# Patient Record
Sex: Male | Born: 1985 | Race: Black or African American | Hispanic: No | Smoking: Current some day smoker
Health system: Southern US, Community
[De-identification: ages and names within clinical notes are randomized; demographics above are authoritative.]

---
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
## 2007-04-11 ENCOUNTER — Emergency Department (HOSPITAL_COMMUNITY): Admission: EM | Admit: 2007-04-11 | Discharge: 2007-04-11 | Payer: Self-pay | Admitting: Emergency Medicine

## 2008-04-03 ENCOUNTER — Emergency Department (HOSPITAL_COMMUNITY): Admission: EM | Admit: 2008-04-03 | Discharge: 2008-04-03 | Payer: Self-pay | Admitting: Emergency Medicine

## 2009-01-17 ENCOUNTER — Emergency Department (HOSPITAL_COMMUNITY): Admission: EM | Admit: 2009-01-17 | Discharge: 2009-01-17 | Payer: Self-pay | Admitting: Emergency Medicine

## 2010-01-04 IMAGING — CR DG CHEST 2V
2 series · 2 of 2 positions shown · non-contrast
Comparison: None

CLINICAL DATA: Shortness of breath; panic attack.

CHEST - 2 VIEW

[w chest pa]
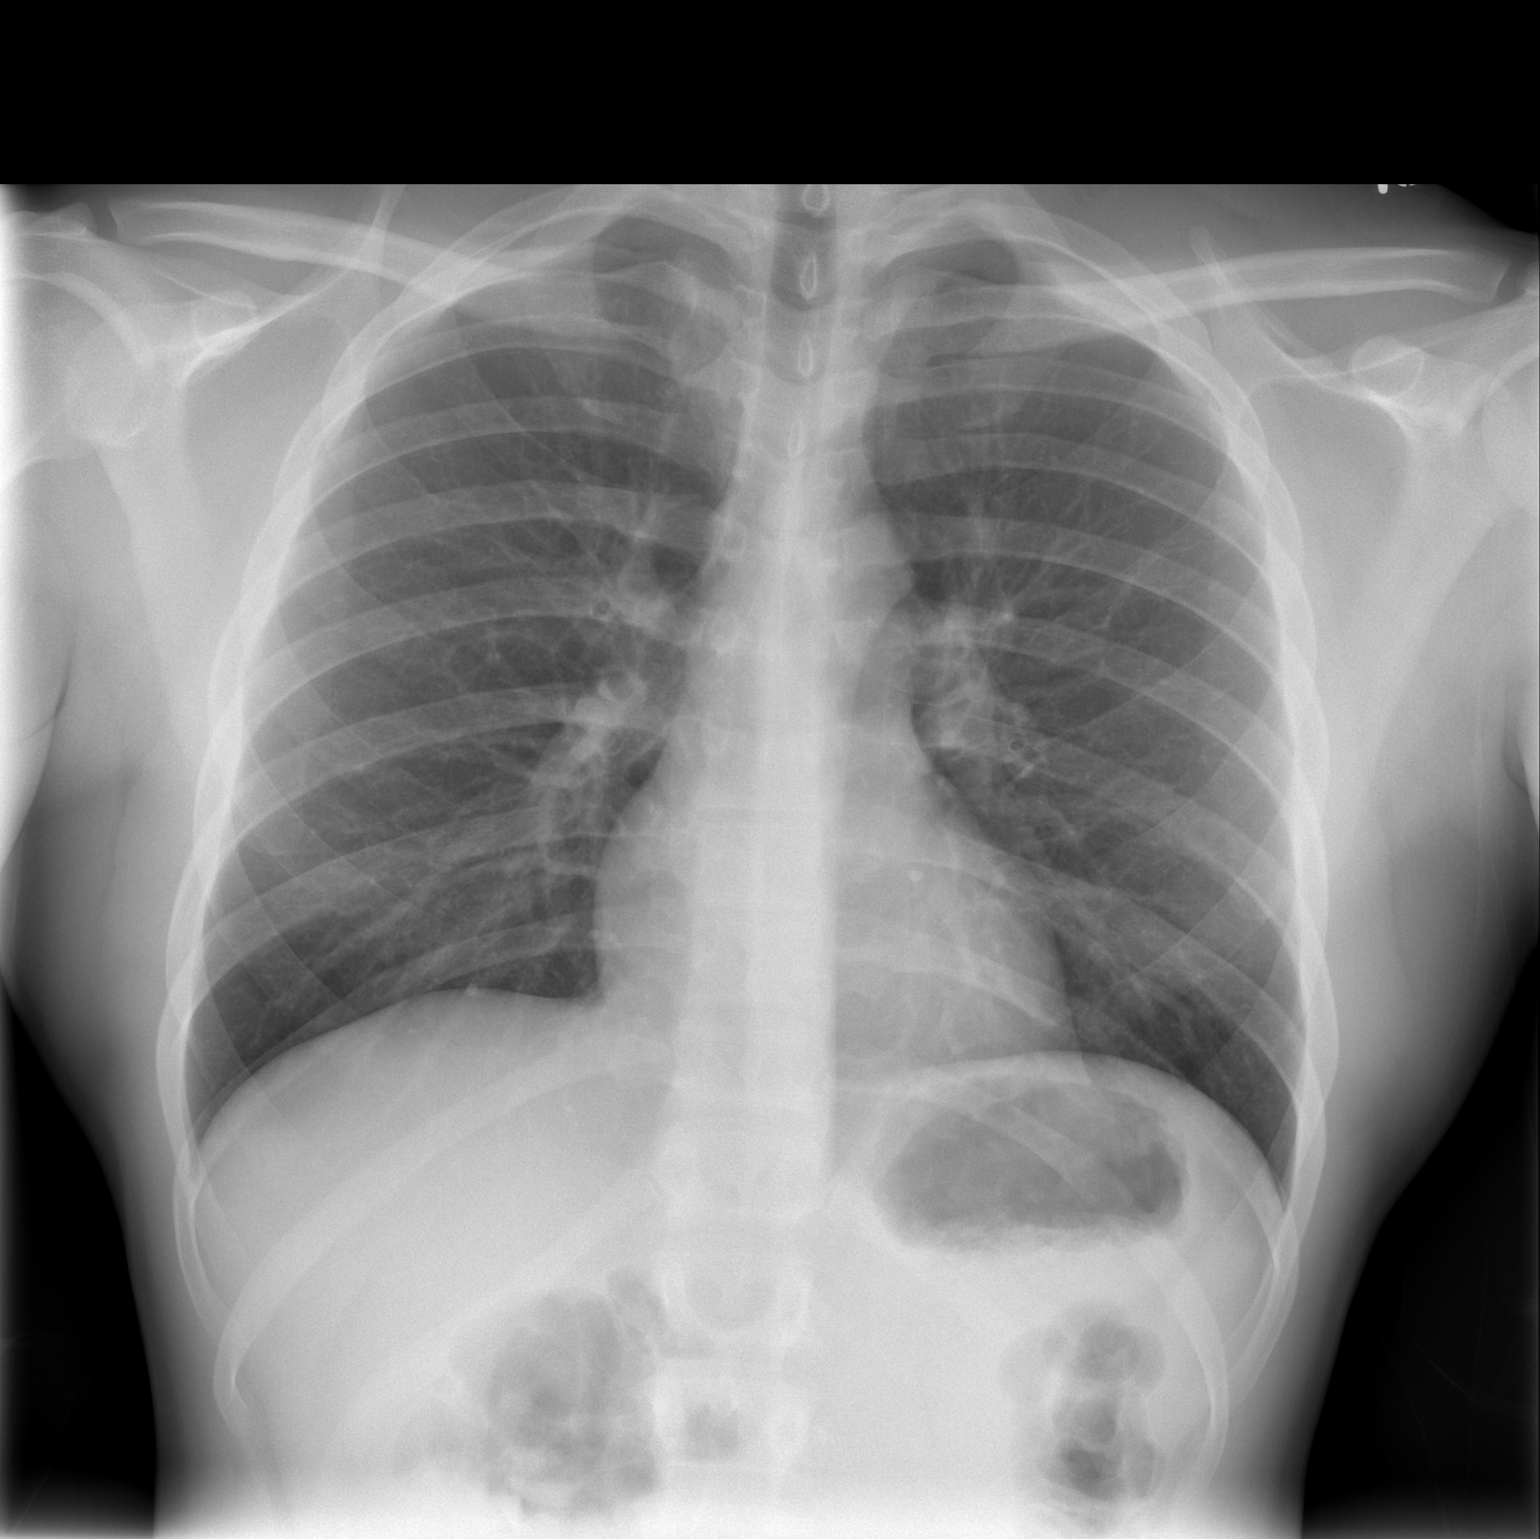

[w chest lat]
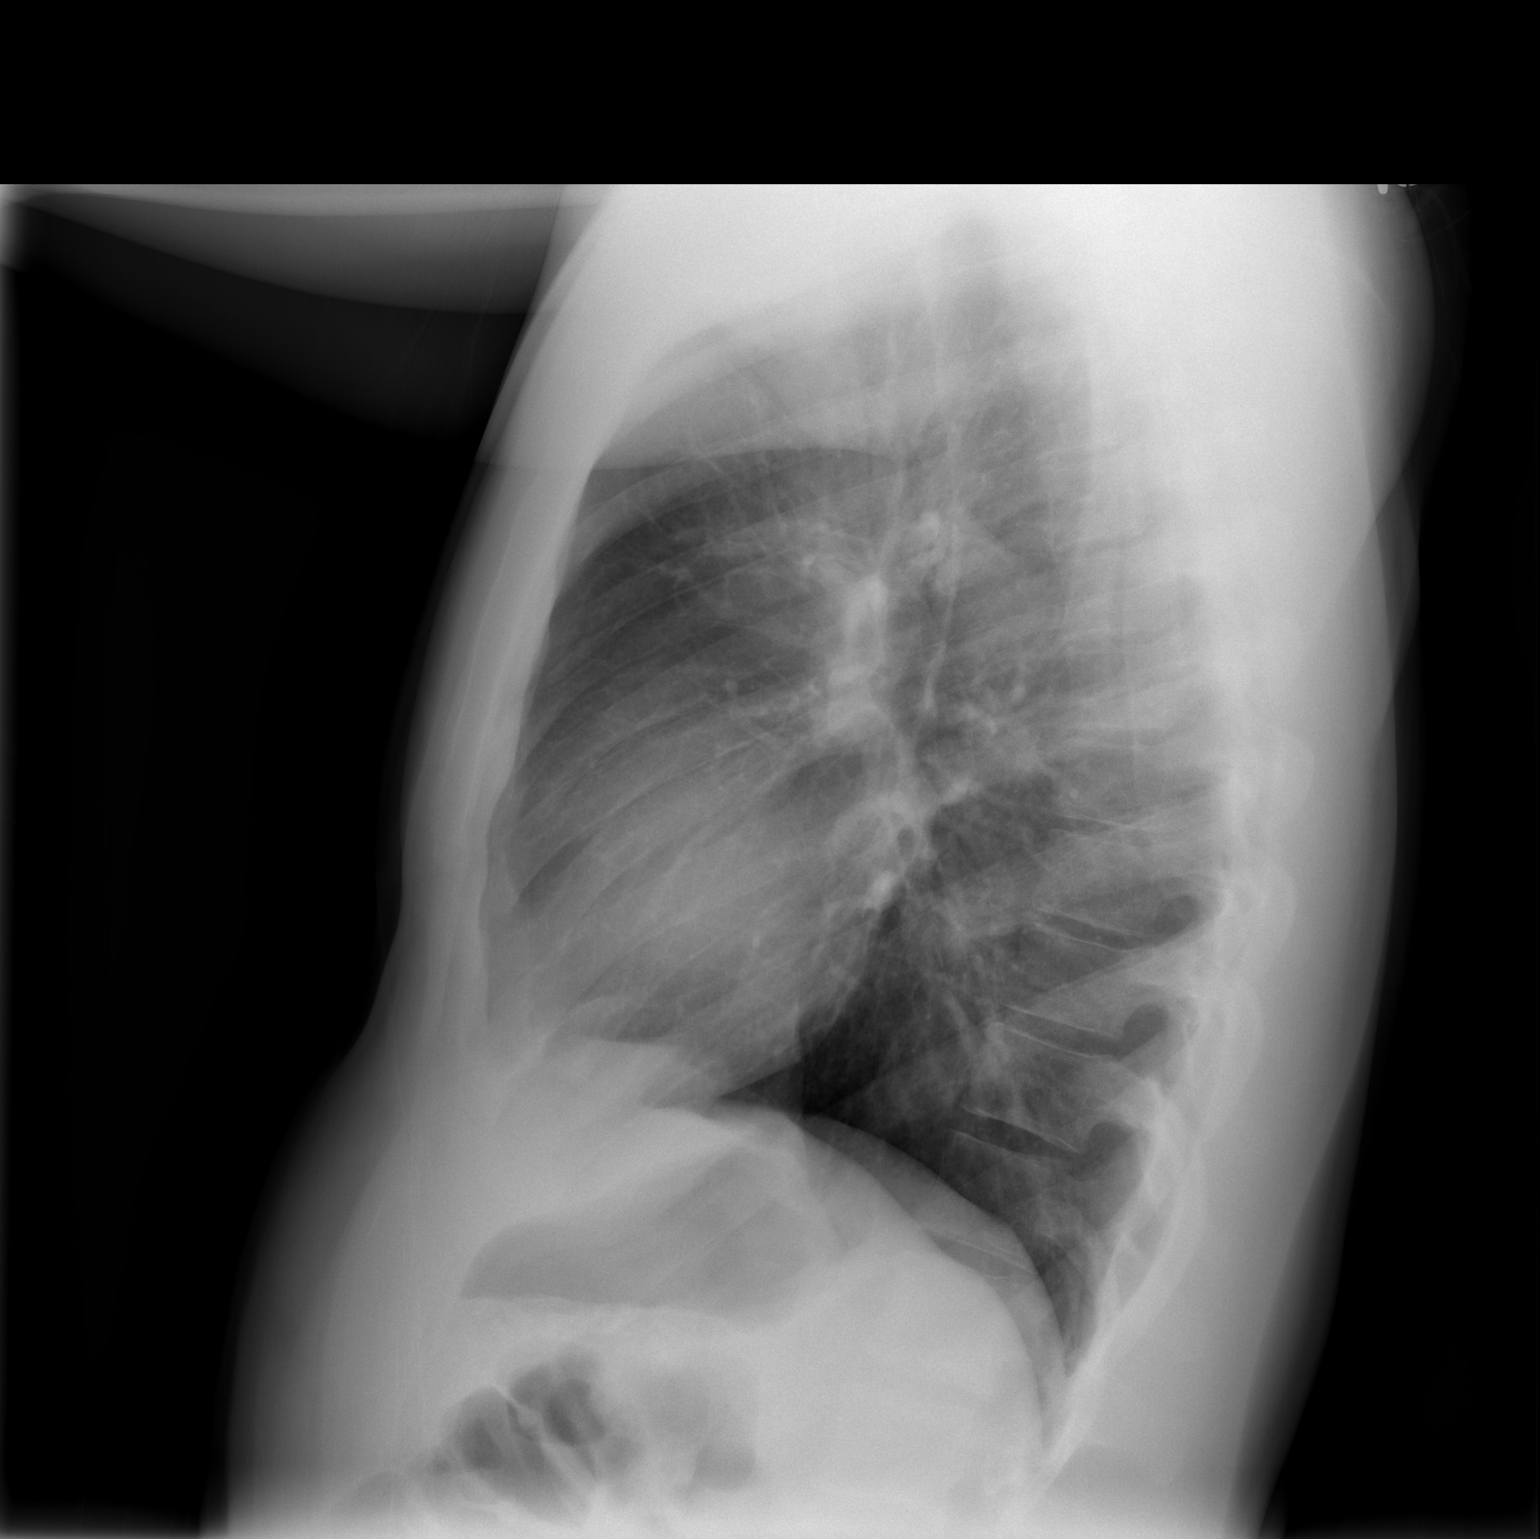

[2 of 2 positions shown; findings below may reference images not displayed]

FINDINGS: The lungs are well-aerated and clear.  Mild peribronchial
thickening is noted.  There is no evidence of focal opacification,
pleural effusion or pneumothorax.

The heart is normal in size; the mediastinal contour is within
normal limits.  No acute osseous abnormalities are seen.
IMPRESSION: No acute cardiopulmonary process seen.

## 2010-07-13 LAB — CBC
MCHC: 32.6 g/dL (ref 30.0–36.0)
RBC: 5.95 MIL/uL — ABNORMAL HIGH (ref 4.22–5.81)
RDW: 13.6 % (ref 11.5–15.5)

## 2010-07-13 LAB — DIFFERENTIAL
Eosinophils Relative: 1 % (ref 0–5)
Lymphs Abs: 0.7 10*3/uL (ref 0.7–4.0)
Monocytes Relative: 8 % (ref 3–12)
Neutrophils Relative %: 82 % — ABNORMAL HIGH (ref 43–77)

## 2010-07-13 LAB — BASIC METABOLIC PANEL
Creatinine, Ser: 1.2 mg/dL (ref 0.4–1.5)
Glucose, Bld: 83 mg/dL (ref 70–99)
Potassium: 3.9 mEq/L (ref 3.5–5.1)
Sodium: 139 mEq/L (ref 135–145)

## 2010-12-17 LAB — RAPID STREP SCREEN (MED CTR MEBANE ONLY): Streptococcus, Group A Screen (Direct): NEGATIVE

## 2014-02-06 ENCOUNTER — Emergency Department (HOSPITAL_COMMUNITY)
Admission: EM | Admit: 2014-02-06 | Discharge: 2014-02-06 | Disposition: A | Discharge status: Home / Self Care | Payer: Self-pay | Attending: Emergency Medicine | Admitting: Emergency Medicine

## 2014-02-06 ENCOUNTER — Encounter (HOSPITAL_COMMUNITY): Payer: Self-pay | Admitting: Emergency Medicine

## 2014-02-06 DIAGNOSIS — Y9289 Other specified places as the place of occurrence of the external cause: Secondary | ICD-10-CM | POA: Insufficient documentation

## 2014-02-06 DIAGNOSIS — Y9389 Activity, other specified: Secondary | ICD-10-CM | POA: Insufficient documentation

## 2014-02-06 DIAGNOSIS — Y998 Other external cause status: Secondary | ICD-10-CM | POA: Insufficient documentation

## 2014-02-06 DIAGNOSIS — T148XXA Other injury of unspecified body region, initial encounter: Secondary | ICD-10-CM

## 2014-02-06 DIAGNOSIS — W500XXA Accidental hit or strike by another person, initial encounter: Secondary | ICD-10-CM | POA: Insufficient documentation

## 2014-02-06 DIAGNOSIS — S00211A Abrasion of right eyelid and periocular area, initial encounter: Secondary | ICD-10-CM | POA: Insufficient documentation

## 2014-02-06 MED ORDER — IBUPROFEN 600 MG PO TABS: 600.0000 mg | ORAL_TABLET | Freq: Four times a day (QID) | ORAL | Status: DC | PRN
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
## 2014-02-06 NOTE — Discharge Instructions (Signed)
Abrasion An abrasion is a cut or scrape of the skin. Abrasions do not extend through all layers of the skin and most heal within 10 days. It is important to care for your abrasion properly to prevent infection. CAUSES  Most abrasions are caused by falling on, or gliding across, the ground or other surface. When your skin rubs on something, the outer and inner layer of skin rubs off, causing an abrasion. DIAGNOSIS  Your caregiver will be able to diagnose an abrasion during a physical exam.  TREATMENT  Your treatment depends on how large and deep the abrasion is. Generally, your abrasion will be cleaned with water and a mild soap to remove any dirt or debris. An antibiotic ointment may be put over the abrasion to prevent an infection. A bandage (dressing) may be wrapped around the abrasion to keep it from getting dirty.  You may need a tetanus shot if:  You cannot remember when you had your last tetanus shot.  You have never had a tetanus shot.  The injury broke your skin. If you get a tetanus shot, your arm may swell, get red, and feel warm to the touch. This is common and not a problem. If you need a tetanus shot and you choose not to have one, there is a rare chance of getting tetanus. Sickness from tetanus can be serious.  HOME CARE INSTRUCTIONS   If a dressing was applied, change it at least once a day or as directed by your caregiver. If the bandage sticks, soak it off with warm water.   Wash the area with water and a mild soap to remove all the ointment 2 times a day. Rinse off the soap and pat the area dry with a clean towel.   Reapply any ointment as directed by your caregiver. This will help prevent infection and keep the bandage from sticking. Use gauze over the wound and under the dressing to help keep the bandage from sticking.   Change your dressing right away if it becomes wet or dirty.   Only take over-the-counter or prescription medicines for pain, discomfort, or fever as  directed by your caregiver.   Follow up with your caregiver within 24-48 hours for a wound check, or as directed. If you were not given a wound-check appointment, look closely at your abrasion for redness, swelling, or pus. These are signs of infection. SEEK IMMEDIATE MEDICAL CARE IF:   You have increasing pain in the wound.   You have redness, swelling, or tenderness around the wound.   You have pus coming from the wound.   You have a fever or persistent symptoms for more than 2-3 days.  You have a fever and your symptoms suddenly get worse.  You have a bad smell coming from the wound or dressing.  MAKE SURE YOU:   Understand these instructions.  Will watch your condition.  Will get help right away if you are not doing well or get worse. Document Released: 12/23/2004 Document Revised: 03/01/2012 Document Reviewed: 02/16/2011 [facility] Patient Information 2015 [place], [place]. This information is not intended to replace advice given to you by your health care provider. Make sure you discuss any questions you have with your health care provider.  Eye Contusion An eye contusion is a deep bruise of the eye. This is often called a "black eye." Contusions are the result of an injury that caused bleeding under the skin. The contusion may turn blue, purple, or yellow. Minor injuries will give you  a painless contusion, but more severe contusions may stay painful and swollen for a few weeks. If the eye contusion only involves the eyelids and tissues around the eye, the injured area will get better within a few days to weeks. However, eye contusions can be serious and affect the eyeball and sight. CAUSES   Blunt injury or trauma to the face or eye area.  A forehead injury that causes the blood under the skin to work its way down to the eyelids.  Rubbing the eyes due to irritation. SYMPTOMS   Swelling and redness around the eye.  Bruising around the eye.  Tenderness, soreness, or  pain around the eye.  Blurry vision.  Tearing.  Eyeball redness. DIAGNOSIS  A diagnosis is usually based on a thorough exam of the eye and surrounding area. The eye must be looked at carefully to make sure it is not injured and to make sure nothing else will threaten your vision. A vision test may be done. An X-ray or computed tomography (CT) scan may be needed to determine if there are any associated injuries, such as broken bones (fractures). TREATMENT  If there is an injury to the eye, treatment will be determined by the nature of the injury. HOME CARE INSTRUCTIONS   Put ice on the injured area.  Put ice in a plastic bag.  Place a towel between your skin and the bag.  Leave the ice on for 15-20 minutes, 03-04 times a day.  If it is determined that there is no injury to the eye, you may continue normal activities.  Sunglasses may be worn to protect your eyes from bright light if light is uncomfortable.  Sleep with your head elevated. You can put an extra pillow under your head. This may help with discomfort.  Only take over-the-counter or prescription medicines for pain, discomfort, or fever as directed by your caregiver. Do not take aspirin for the first few days. This may increase bruising. SEEK IMMEDIATE MEDICAL CARE IF:   You have any form of vision loss.  You have double vision.  You feel nauseous.  You feel dizzy, sleepy, or like you will faint.  You have any fluid discharge from the eye or your nose.  You have swelling and discoloration that does not fade. MAKE SURE YOU:   Understand these instructions.  Will watch your condition.  Will get help right away if you are not doing well or get worse. Document Released: 03/12/2000 Document Revised: 06/07/2011 Document Reviewed: 01/29/2011 [facility] Patient Information 2015 [place]. This information is not intended to replace advice given to you by your health care provider. Make sure you discuss any  questions you have with your health care provider.
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
## 2014-02-06 NOTE — ED Notes (Signed)
Bed: WTR8 Expected date:  Expected time:  Means of arrival:  Comments: [facility] [employer]

## 2019-07-10 ENCOUNTER — Emergency Department (HOSPITAL_COMMUNITY): Admission: EM | Admit: 2019-07-10 | Discharge: 2019-07-10 | Discharge status: Absent without leave | Payer: Self-pay

## 2019-07-10 ENCOUNTER — Other Ambulatory Visit: Payer: Self-pay

## 2019-09-24 ENCOUNTER — Encounter (HOSPITAL_COMMUNITY): Payer: Self-pay | Admitting: Emergency Medicine

## 2019-09-24 ENCOUNTER — Emergency Department (HOSPITAL_COMMUNITY)
Admission: EM | Admit: 2019-09-24 | Discharge: 2019-09-25 | Disposition: A | Discharge status: Home / Self Care | Payer: Self-pay | Attending: Emergency Medicine | Admitting: Emergency Medicine

## 2019-09-24 ENCOUNTER — Other Ambulatory Visit: Payer: Self-pay

## 2019-09-24 DIAGNOSIS — K1379 Other lesions of oral mucosa: Secondary | ICD-10-CM

## 2019-09-24 DIAGNOSIS — K0889 Other specified disorders of teeth and supporting structures: Secondary | ICD-10-CM | POA: Insufficient documentation

## 2019-09-24 MED ORDER — KETOROLAC TROMETHAMINE 60 MG/2ML IM SOLN
60.0000 mg | Freq: Once | INTRAMUSCULAR | Status: AC
  Administered 2019-09-25: 60 mg via INTRAMUSCULAR
  Filled 2019-09-24: qty 2

## 2019-09-24 MED ORDER — IBUPROFEN 800 MG PO TABS: 800.0000 mg | ORAL_TABLET | Freq: Three times a day (TID) | ORAL | 0 refills | Status: AC

## 2019-09-24 MED ORDER — CLINDAMYCIN HCL 300 MG PO CAPS: 300.0000 mg | ORAL_CAPSULE | Freq: Four times a day (QID) | ORAL | 0 refills | Status: AC

## 2019-09-24 MED ORDER — CLINDAMYCIN HCL 300 MG PO CAPS
300.0000 mg | ORAL_CAPSULE | Freq: Once | ORAL | Status: AC
  Administered 2019-09-25: 300 mg via ORAL
  Filled 2019-09-24: qty 1

## 2019-09-24 MED ORDER — OXYCODONE-ACETAMINOPHEN 5-325 MG PO TABS
2.0000 | ORAL_TABLET | Freq: Once | ORAL | Status: AC
  Administered 2019-09-25: 2 via ORAL
  Filled 2019-09-24: qty 2

## 2019-09-24 NOTE — ED Triage Notes (Addendum)
Pt c/o of dental pain on right side and jaw pain on both sides. Pain started 2 - 3 days ago. Pt describes pain as sharpe and constant. Pt denies injury to the area. Pt having difficulty eating due to pain. Home pain medication with no relief.

## 2019-09-24 NOTE — Discharge Instructions (Addendum)
Try using sensodyne toothpaste for the next few days and see if it helps. If symptoms not improving by Thursday, consider seeing dentist.

## 2019-09-25 NOTE — ED Provider Notes (Signed)
[facility]-EMERGENCY DEPT Provider Note   CSN: [id number] Arrival date & time: 09/24/19  2316     History Chief Complaint  Patient presents with  . Dental Pain    Benjamin James is a 34 y.o. male.   Dental Pain Location:  Generalized Quality:  Aching and sharp Severity:  Mild Onset quality:  Gradual Duration:  3 days Timing:  Intermittent Progression:  Worsening Chronicity:  New Worsened by:  Cold food/drink Ineffective treatments:  None tried Associated symptoms: no congestion   Risk factors: no alcohol problem        History reviewed. No pertinent past medical history.  There are no problems to display for this patient.   History reviewed. No pertinent surgical history.     No family history on file.  Social History   Tobacco Use  . Smoking status: Not on file  Substance Use Topics  . Alcohol use: Not on file  . Drug use: Not on file    Home Medications Prior to Admission medications   Medication Sig Start Date End Date Taking? Authorizing Provider  clindamycin (CLEOCIN) 300 MG capsule Take 1 capsule (300 mg total) by mouth 4 (four) times daily. X 7 days 09/24/19   Lareen Mullings, Barbara Cower, MD  ibuprofen (ADVIL) 800 MG tablet Take 1 tablet (800 mg total) by mouth 3 (three) times daily. 09/24/19   Qamar Aughenbaugh, Barbara Cower, MD    Allergies    Patient has no known allergies.  Review of Systems   Review of Systems  HENT: Negative for congestion.   All other systems reviewed and are negative.   Physical Exam Updated Vital Signs BP (!) 148/91 (BP Location: Left Arm)   Pulse (!) 59   Temp 98.5 F (36.9 C) (Oral)   Resp 16   SpO2 96%   Physical Exam Vitals and nursing note reviewed.  Constitutional:      Appearance: He is well-developed.  HENT:     Head: Normocephalic and atraumatic.     Mouth/Throat:     Mouth: Mucous membranes are moist.     Pharynx: Oropharynx is clear.     Comments: Receding gingiva, no obvious swelling, erythema or  drainable abscesses. Significant halitosis.  Eyes:     Pupils: Pupils are equal, round, and reactive to light.  Cardiovascular:     Rate and Rhythm: Normal rate.  Pulmonary:     Effort: Pulmonary effort is normal. No respiratory distress.  Abdominal:     General: There is no distension.     Tenderness: There is no abdominal tenderness.  Musculoskeletal:        General: Normal range of motion.     Cervical back: Normal range of motion.  Skin:    General: Skin is warm and dry.  Neurological:     General: No focal deficit present.     Mental Status: He is alert.     Cranial Nerves: No cranial nerve deficit.     Sensory: No sensory deficit.     ED Results / Procedures / Treatments   Labs (all labs ordered are listed, but only abnormal results are displayed) Labs Reviewed - No data to display  EKG None  Radiology No results found.  Procedures Procedures (including critical care time)  Medications Ordered in ED Medications  oxyCODONE-acetaminophen (PERCOCET/ROXICET) 5-325 MG per tablet 2 tablet (2 tablets Oral Given 09/25/19 0005)  ketorolac (TORADOL) injection 60 mg (60 mg Intramuscular Given 09/25/19 0006)  clindamycin (CLEOCIN) capsule 300 mg (300  mg Oral Given 09/25/19 0006)    ED Course  I have reviewed the triage vital signs and the nursing notes.  Pertinent labs & imaging results that were available during my care of the patient were reviewed by me and considered in my medical decision making (see chart for details).    MDM Rules/Calculators/A&P                          With pain and severe odor of breath, concern for dental abscess. With worsening symptoms 2/2 temperature changes then consider gingivitis and nerve exposure, will try sensodyne at home. otherwise dental follow up  Final Clinical Impression(s) / ED Diagnoses Final diagnoses:  Oral pain    Rx / DC Orders ED Discharge Orders         Ordered    clindamycin (CLEOCIN) 300 MG capsule  4 times  daily     Discontinue  Reprint     09/24/19 2352    ibuprofen (ADVIL) 800 MG tablet  3 times daily     Discontinue  Reprint     09/24/19 2352           Britlyn Martine, Barbara Cower, MD 09/25/19 0140

## 2020-11-08 ENCOUNTER — Other Ambulatory Visit: Payer: Self-pay

## 2020-11-08 ENCOUNTER — Emergency Department (HOSPITAL_BASED_OUTPATIENT_CLINIC_OR_DEPARTMENT_OTHER): Payer: Self-pay

## 2020-11-08 ENCOUNTER — Emergency Department (HOSPITAL_BASED_OUTPATIENT_CLINIC_OR_DEPARTMENT_OTHER)
Admission: EM | Admit: 2020-11-08 | Discharge: 2020-11-08 | Disposition: A | Discharge status: Home / Self Care | Payer: Self-pay | Attending: Emergency Medicine | Admitting: Emergency Medicine

## 2020-11-08 ENCOUNTER — Encounter (HOSPITAL_BASED_OUTPATIENT_CLINIC_OR_DEPARTMENT_OTHER): Payer: Self-pay

## 2020-11-08 DIAGNOSIS — R1013 Epigastric pain: Secondary | ICD-10-CM | POA: Insufficient documentation

## 2020-11-08 DIAGNOSIS — Z87891 Personal history of nicotine dependence: Secondary | ICD-10-CM | POA: Insufficient documentation

## 2020-11-08 DIAGNOSIS — R109 Unspecified abdominal pain: Secondary | ICD-10-CM

## 2020-11-08 DIAGNOSIS — R112 Nausea with vomiting, unspecified: Secondary | ICD-10-CM | POA: Insufficient documentation

## 2020-11-08 LAB — COMPREHENSIVE METABOLIC PANEL
ALT: 19 U/L (ref 0–44)
AST: 18 U/L (ref 15–41)
Albumin: 4.5 g/dL (ref 3.5–5.0)
Alkaline Phosphatase: 55 U/L (ref 38–126)
Anion gap: 9 (ref 5–15)
BUN: 11 mg/dL (ref 6–20)
CO2: 27 mmol/L (ref 22–32)
Calcium: 9.2 mg/dL (ref 8.9–10.3)
Chloride: 100 mmol/L (ref 98–111)
Creatinine, Ser: 1.35 mg/dL — ABNORMAL HIGH (ref 0.61–1.24)
GFR, Estimated: >60 mL/min (ref >60)
Glucose, Bld: 113 mg/dL — ABNORMAL HIGH (ref 70–99)
Potassium: 3.9 mmol/L (ref 3.5–5.1)
Sodium: 136 mmol/L (ref 135–145)
Total Bilirubin: 1.6 mg/dL — ABNORMAL HIGH (ref 0.3–1.2)
Total Protein: 7.6 g/dL (ref 6.5–8.1)

## 2020-11-08 LAB — CBC WITH DIFFERENTIAL/PLATELET
Abs Immature Granulocytes: 0.02 10*3/uL (ref 0.00–0.07)
Basophils Absolute: 0 10*3/uL (ref 0.0–0.1)
Basophils Relative: 0 %
Eosinophils Absolute: 0.1 10*3/uL (ref 0.0–0.5)
Eosinophils Relative: 3 %
HCT: 45.1 % (ref 39.0–52.0)
Hemoglobin: 15.5 g/dL (ref 13.0–17.0)
Immature Granulocytes: 0 %
Lymphocytes Relative: 36 %
Lymphs Abs: 1.9 10*3/uL (ref 0.7–4.0)
MCH: 28.2 pg (ref 26.0–34.0)
MCHC: 34.4 g/dL (ref 30.0–36.0)
MCV: 82.1 fL (ref 80.0–100.0)
Monocytes Absolute: 0.5 10*3/uL (ref 0.1–1.0)
Monocytes Relative: 9 %
Neutro Abs: 2.8 10*3/uL (ref 1.7–7.7)
Neutrophils Relative %: 52 %
Platelets: 249 10*3/uL (ref 150–400)
RBC: 5.49 MIL/uL (ref 4.22–5.81)
RDW: 13.3 % (ref 11.5–15.5)
WBC: 5.4 10*3/uL (ref 4.0–10.5)
nRBC: 0 % (ref 0.0–0.2)

## 2020-11-08 LAB — LIPASE, BLOOD: Lipase: 26 U/L (ref 11–51)

## 2020-11-08 MED ORDER — ONDANSETRON 4 MG PO TBDP: 4.0000 mg | ORAL_TABLET | Freq: Three times a day (TID) | ORAL | 0 refills | Status: DC | PRN

## 2020-11-08 MED ORDER — IOHEXOL 300 MG/ML  SOLN
100.0000 mL | Freq: Once | INTRAMUSCULAR | Status: AC | PRN
  Administered 2020-11-08: 100 mL via INTRAVENOUS

## 2020-11-08 MED ORDER — DICYCLOMINE HCL 20 MG PO TABS: 20.0000 mg | ORAL_TABLET | Freq: Three times a day (TID) | ORAL | 0 refills | Status: AC | PRN

## 2020-11-08 MED ORDER — PANTOPRAZOLE SODIUM 40 MG PO TBEC: 40.0000 mg | DELAYED_RELEASE_TABLET | Freq: Every day | ORAL | 0 refills | Status: AC

## 2020-11-08 MED ORDER — ONDANSETRON HCL 4 MG/2ML IJ SOLN
4.0000 mg | Freq: Once | INTRAMUSCULAR | Status: AC
  Administered 2020-11-08: 4 mg via INTRAVENOUS
  Filled 2020-11-08: qty 2

## 2020-11-08 MED ORDER — PANTOPRAZOLE SODIUM 40 MG IV SOLR
40.0000 mg | Freq: Once | INTRAVENOUS | Status: AC
  Administered 2020-11-08: 40 mg via INTRAVENOUS
  Filled 2020-11-08: qty 40

## 2020-11-08 NOTE — ED Provider Notes (Signed)
Emergency Department Provider Note   I have reviewed the triage vital signs and the nursing notes.   HISTORY  Chief Complaint Abdominal Pain and Emesis   HPI Benjamin James is a 35 y.o. male with no significant past medical history presents emergency department with epigastric abdominal discomfort with nausea and vomiting.  Symptoms been building over the past 5 days.  No similar pain in the past.  Patient states when he eats it feels like it settles in the lower part of his chest/top part of his abdomen and feels uncomfortable.  He has a "bubbling" feeling in his abdomen and soon after eating has vomiting.  No pain radiating into the chest.  No shortness of breath.  Reports some subjective fevers at home from time to time.  No diarrhea.  No sick contacts.   History reviewed. No pertinent past medical history.  There are no problems to display for this patient.   History reviewed. No pertinent surgical history.  Allergies Patient has no known allergies.  History reviewed. No pertinent family history.  Social History Social History   Tobacco Use   Smoking status: Former    Types: Cigarettes   Smokeless tobacco: Never    Review of Systems  Constitutional: No fever/chills Eyes: No visual changes. ENT: No sore throat. Cardiovascular: Denies chest pain. Respiratory: Denies shortness of breath. Gastrointestinal: Positive epigastric abdominal pain. Positive nausea and vomiting.  No diarrhea.  No constipation. Genitourinary: Negative for dysuria. Musculoskeletal: Negative for back pain. Skin: Negative for rash. Neurological: Negative for headaches, focal weakness or numbness.  10-point ROS otherwise negative.  ____________________________________________   PHYSICAL EXAM:  VITAL SIGNS: ED Triage Vitals  Enc Vitals Group     BP 11/08/20 0720 (!) 149/92     Pulse Rate 11/08/20 0720 72     Resp 11/08/20 0720 18     Temp 11/08/20 0718 99.1 F (37.3 C)     Temp  Source 11/08/20 0718 Oral     SpO2 11/08/20 0720 100 %     Weight 11/08/20 0722 180 lb (81.6 kg)     Height 11/08/20 0722 5\' 9"  (1.753 m)   Constitutional: Alert and oriented. Well appearing and in no acute distress. Eyes: Conjunctivae are normal.  Head: Atraumatic. Nose: No congestion/rhinnorhea. Mouth/Throat: Mucous membranes are moist.  Neck: No stridor.  Cardiovascular: Normal rate, regular rhythm. Good peripheral circulation. Grossly normal heart sounds.   Respiratory: Normal respiratory effort.  No retractions. Lungs CTAB. Gastrointestinal: Soft with epigastric and RUQ tenderness with some voluntary guarding. No lower abdominal tenderness. No distention.  Musculoskeletal: No gross deformities of extremities. Neurologic:  Normal speech and language. Skin:  Skin is warm, dry and intact. No rash noted.   ____________________________________________   LABS (all labs ordered are listed, but only abnormal results are displayed)  Labs Reviewed  COMPREHENSIVE METABOLIC PANEL - Abnormal; Notable for the following components:      Result Value   Glucose, Bld 113 (*)    Creatinine, Ser 1.35 (*)    Total Bilirubin 1.6 (*)    All other components within normal limits  LIPASE, BLOOD  CBC WITH DIFFERENTIAL/PLATELET   ____________________________________________  RADIOLOGY  CT ABDOMEN PELVIS W CONTRAST  Result Date: 11/08/2020 CLINICAL DATA:  35 year old male with abdominal pain, epigastric pain for 1 week. Postprandial symptoms. EXAM: CT ABDOMEN AND PELVIS WITH CONTRAST TECHNIQUE: Multidetector CT imaging of the abdomen and pelvis was performed using the standard protocol following bolus administration of intravenous contrast. CONTRAST:  OMNIPAQUE IOHEXOL 300 MG/ML  SOLN COMPARISON:  None. FINDINGS: Lower chest: Negative. Hepatobiliary: Negative liver and gallbladder. Gallbladder appears partially contracted. Pancreas: Negative. Spleen: Negative. Adrenals/Urinary Tract: Negative.  Symmetric and normal adrenal glands, kidneys. No nephrolithiasis or hydronephrosis. Decompressed ureters. Unremarkable bladder. Stomach/Bowel: Mildly redundant large bowel with intermittent retained stool, most pronounced in the transverse colon. Normal appendix suspected on coronal image 51, sagittal image 44. no large bowel inflammation. Terminal ileum is within normal limits. No dilated small bowel. Decompressed stomach and duodenum. No perigastric inflammation. No free air or free fluid. No mesenteric inflammation identified. Vascular/Lymphatic: Major arterial structures in the abdomen and pelvis appear patent and normal. Patent portal venous system. No lymphadenopathy. Reproductive: Negative. Other: No pelvic free fluid. Musculoskeletal: Negative; unfused right L1 transverse process ossification center, normal variant. IMPRESSION: Negative CT Abdomen and Pelvis.  No explanation for abdominal pain. Electronically Signed   By: Odessa Fleming M.D.   On: 11/08/2020 09:44   US Abdomen Limited RUQ (LIVER/GB)  Result Date: 11/08/2020 CLINICAL DATA:  Epigastric pain, nausea and vomiting for 6 days. EXAM: ULTRASOUND ABDOMEN LIMITED RIGHT UPPER QUADRANT COMPARISON:  None. FINDINGS: Gallbladder: No gallstones or wall thickening visualized. No sonographic Murphy sign noted by sonographer. Common bile duct: Diameter: 2 mm Liver: No focal lesion identified. Within normal limits in parenchymal echogenicity. Portal vein is patent on color Doppler imaging with normal direction of blood flow towards the liver. Other: None. IMPRESSION: Normal RIGHT upper quadrant ultrasound. Electronically Signed   By: Bary Richard M.D.   On: 11/08/2020 10:48    ____________________________________________   PROCEDURES  Procedure(s) performed:   Procedures  None  ____________________________________________   INITIAL IMPRESSION / ASSESSMENT AND PLAN / ED COURSE  Pertinent labs & imaging results that were available during my care of  the patient were reviewed by me and considered in my medical decision making (see chart for details).   Patient presents emergency department with abdominal pain with nausea vomiting.  He has some tenderness with voluntary guarding in the upper abdomen especially in the mid epigastric and right upper quadrant region.  Mild hypertension here.  No tachycardia.  Afebrile.  Given his abdominal exam I do plan for CT imaging of the abdomen pelvis.  Differential includes acute cholecystitis, peptic ulcer disease, with lower suspicion for perforated ulcer, pancreatitis (complicated).   CT imaging with no acute findings to explain patient's symptoms.  Followed with right upper quadrant ultrasound which is similarly within normal limits.  Patient is feeling improved after treatment in the emergency department.  Labs showed no normal lipase, normal LFTs and only mildly elevated bilirubin.  No leukocytosis.  Seems consistent with GERD vs PUD.  Patient would likely benefit from upper endoscopy.  We discussed this and I have given contact information for the GI practice on-call.  Discussed that he may need referral from his PCP.  Discharged home with symptom management medications and PPI. Discussed ED return precautions.  ____________________________________________  FINAL CLINICAL IMPRESSION(S) / ED DIAGNOSES  Final diagnoses:  Abdominal pain     MEDICATIONS GIVEN DURING THIS VISIT:  Medications  ondansetron (ZOFRAN) injection 4 mg (4 mg Intravenous Given 11/08/20 0739)  pantoprazole (PROTONIX) injection 40 mg (40 mg Intravenous Given 11/08/20 0738)  iohexol (OMNIPAQUE) 300 MG/ML solution 100 mL (100 mLs Intravenous Contrast Given 11/08/20 0853)     NEW OUTPATIENT MEDICATIONS STARTED DURING THIS VISIT:  Discharge Medication List as of 11/08/2020 10:53 AM     START taking these medications  Details  dicyclomine (BENTYL) 20 MG tablet Take 1 tablet (20 mg total) by mouth 3 (three) times daily as needed  for spasms (abdominal cramping)., Starting Sat 11/08/2020, Print    ondansetron (ZOFRAN ODT) 4 MG disintegrating tablet Take 1 tablet (4 mg total) by mouth every 8 (eight) hours as needed., Starting Sat 11/08/2020, Print    pantoprazole (PROTONIX) 40 MG tablet Take 1 tablet (40 mg total) by mouth daily., Starting Sat 11/08/2020, Until Mon 12/08/2020, Print        Note:  This document was prepared using Dragon voice recognition software and may include unintentional dictation errors.  Alona Bene, MD, [facility] Emergency Medicine    Katria Botts, Arlyss Repress, MD 11/09/20 [phone]

## 2020-11-08 NOTE — ED Triage Notes (Addendum)
"  Burning" upper abdominal pain with vomiting since Monday, patient states "when I eat it feels like it just stays in my chest and I have to throw it up".  Possible subjective fever.

## 2020-11-08 NOTE — Discharge Instructions (Addendum)
You have been seen in the Emergency Department (ED) for abdominal pain.  Your evaluation did not identify a clear cause of your symptoms but was generally reassuring.  Please follow up as instructed above regarding today's emergent visit and the symptoms that are bothering you. I would like for you to call the GI doctors listed for an appointment and establish care with a PCP if you do not already have one.   Return to the ED if your abdominal pain worsens or fails to improve, you develop bloody vomiting, bloody diarrhea, you are unable to tolerate fluids due to vomiting, fever greater than 101, or other symptoms that concern you.

## 2021-10-26 IMAGING — US US ABDOMEN LIMITED
1 series · 14 of 25 positions shown · non-contrast
Comparison: None.

CLINICAL DATA: Epigastric pain, nausea and vomiting for 6 days.

EXAM:
ULTRASOUND ABDOMEN LIMITED RIGHT UPPER QUADRANT

[Series 1: us abdomen limited · 14 of 35 slices shown]
[im 1/35]
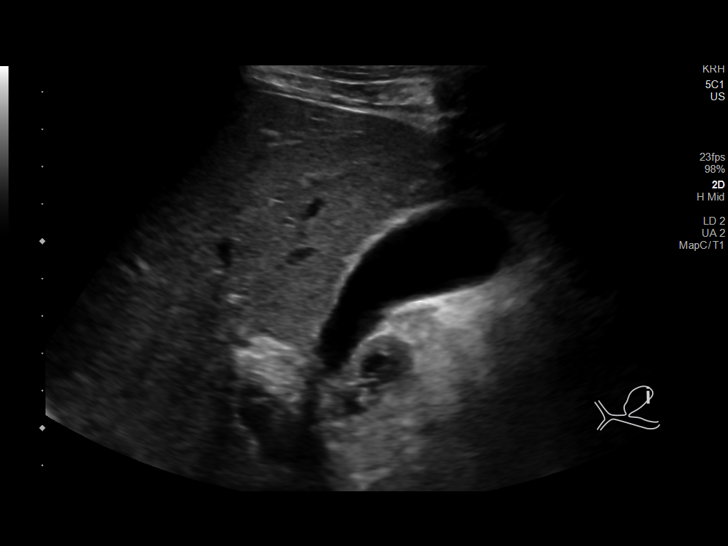
[im 3/35]
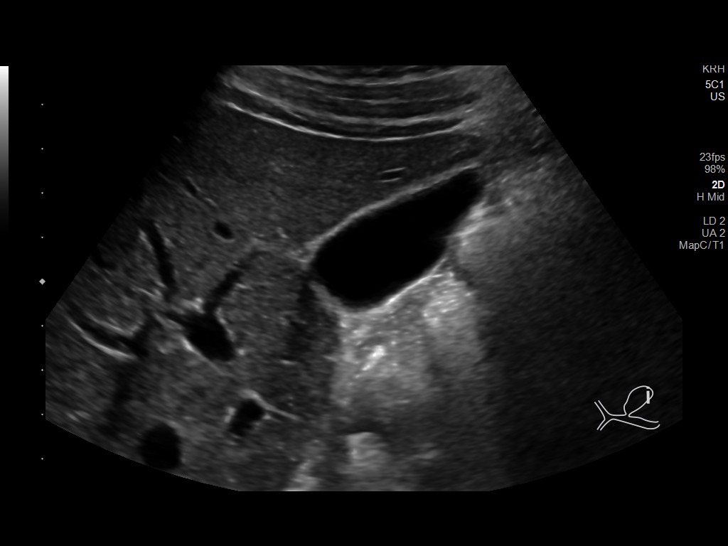
[im 6/35]
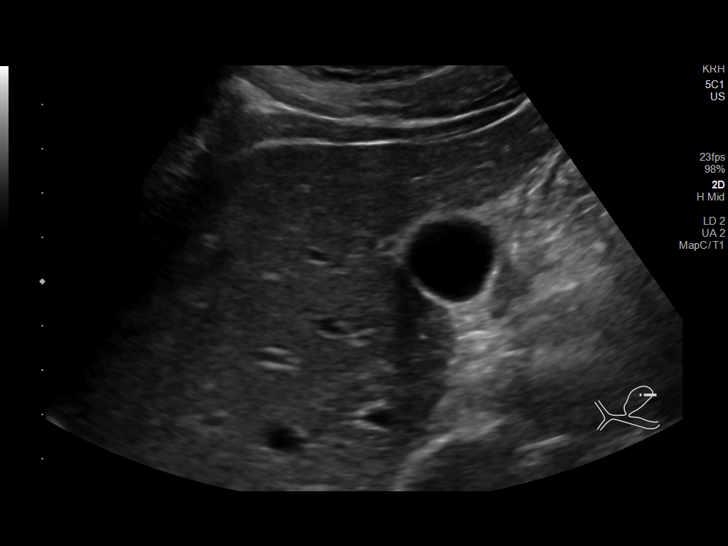
[im 9/35]
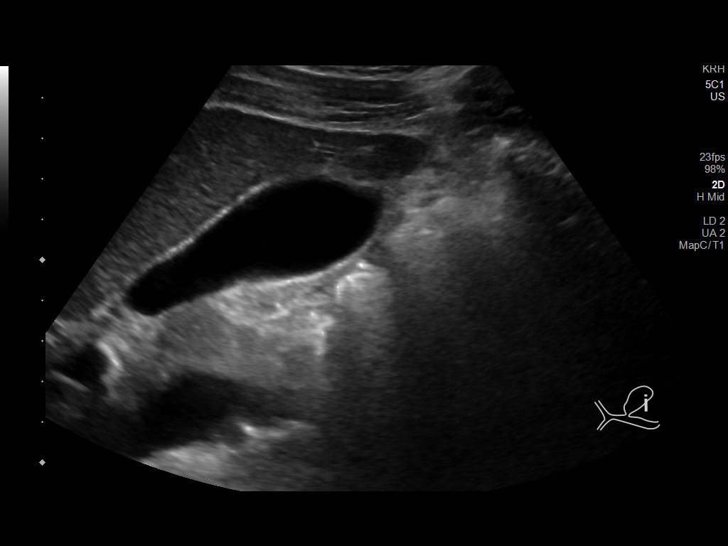
[im 12/35]
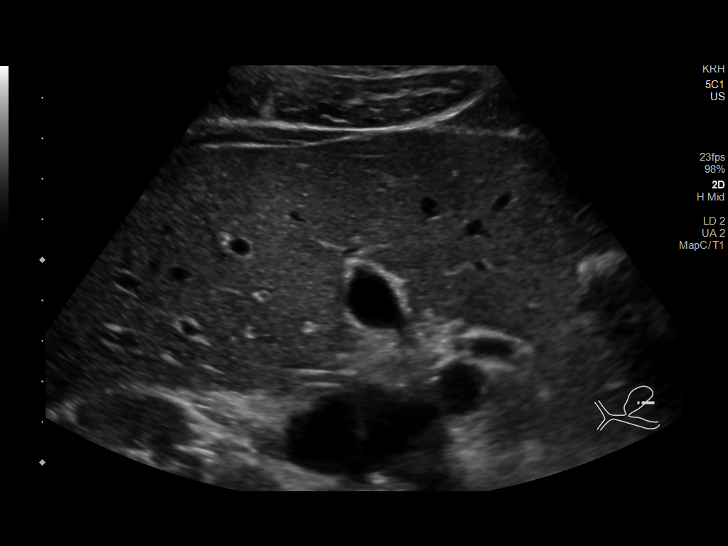
[im 13/35]
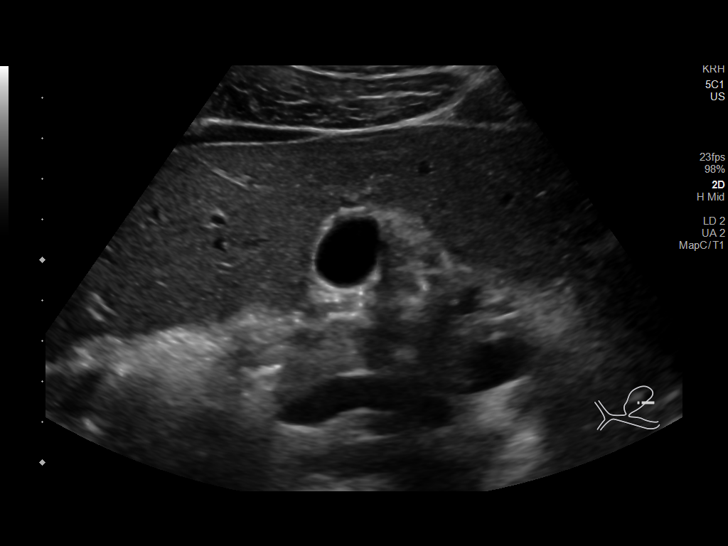
[im 16/35]
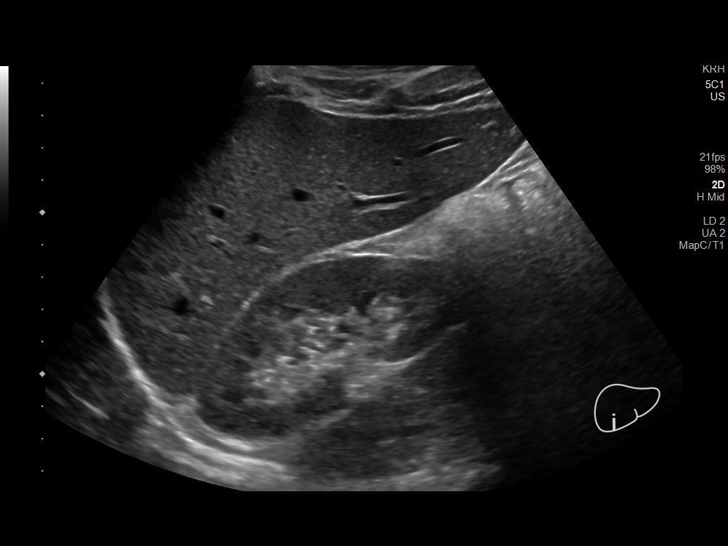
[im 19/35]
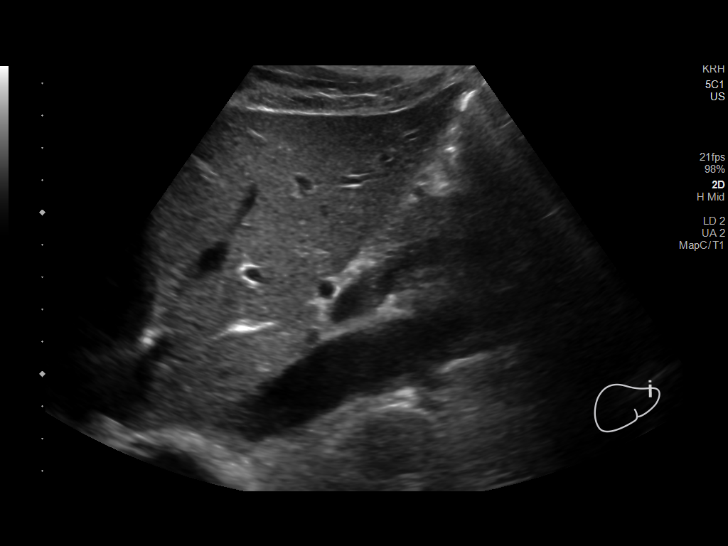
[im 22/35]
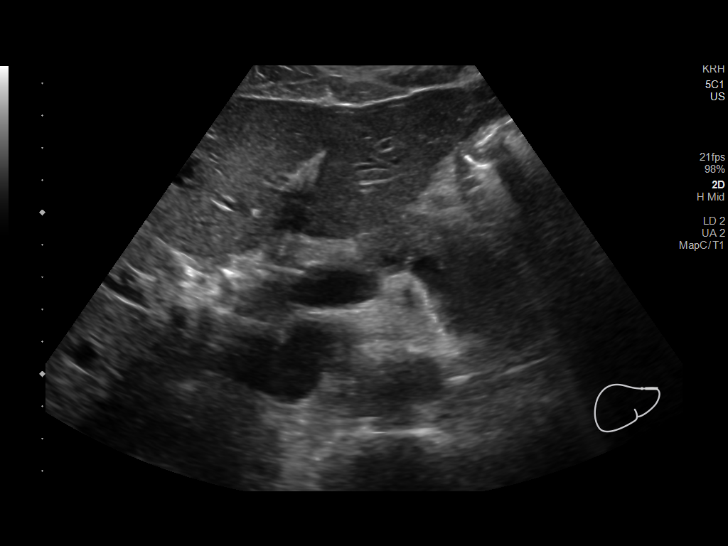
[im 23/35]
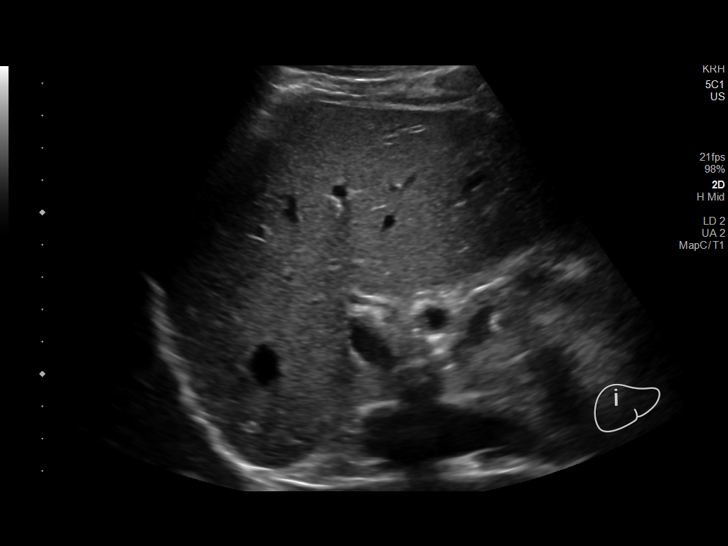
[im 26/35]
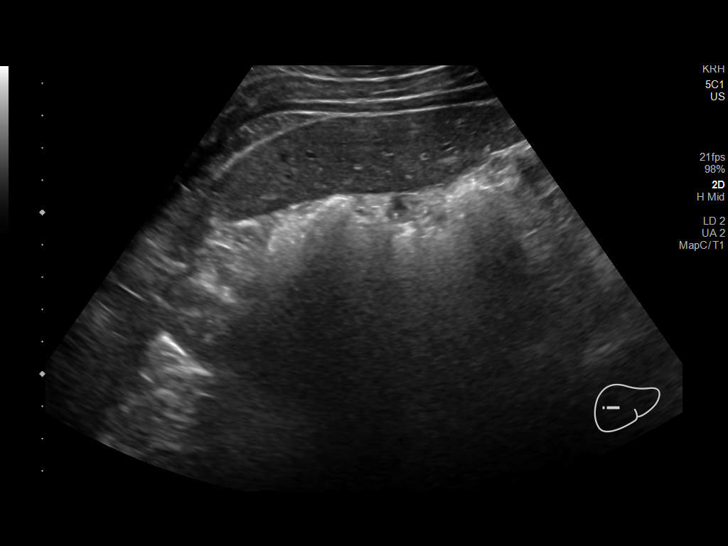
[im 29/35]
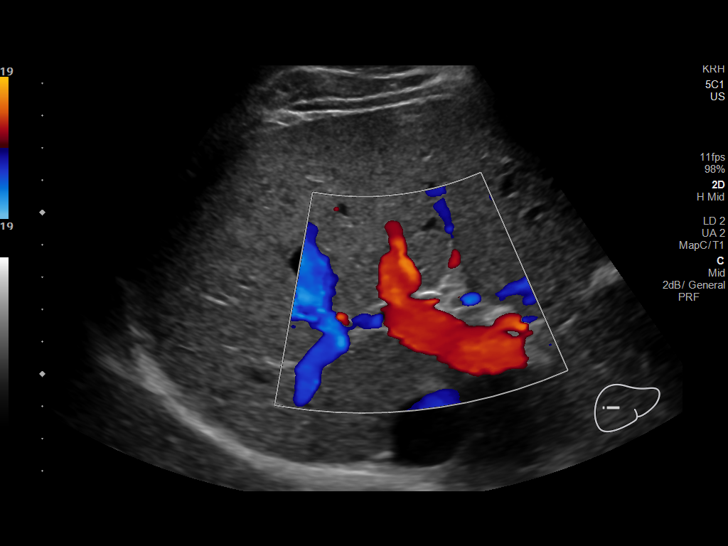
[im 32/35]
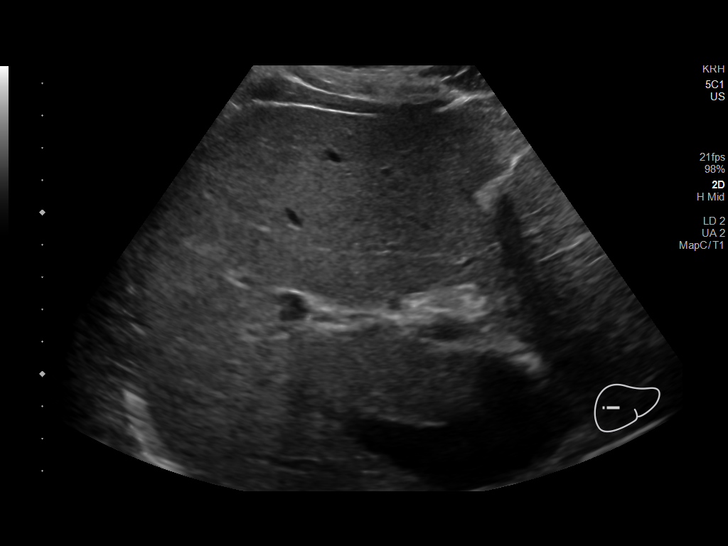
[im 35/35]
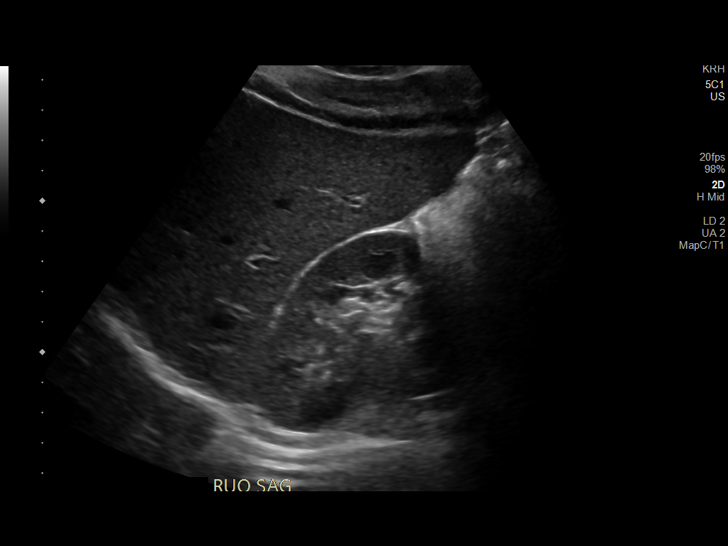

[14 of 25 positions shown; findings below may reference images not displayed]

FINDINGS: Gallbladder:

No gallstones or wall thickening visualized. No sonographic Murphy
sign noted by sonographer.

Common bile duct:

Diameter: 2 mm

Liver:

No focal lesion identified. Within normal limits in parenchymal
echogenicity. Portal vein is patent on color Doppler imaging with
normal direction of blood flow towards the liver.

Other: None.
IMPRESSION: Normal RIGHT upper quadrant ultrasound.

## 2022-05-19 ENCOUNTER — Other Ambulatory Visit: Payer: Self-pay

## 2022-05-19 DIAGNOSIS — M546 Pain in thoracic spine: Secondary | ICD-10-CM | POA: Diagnosis present

## 2022-05-19 DIAGNOSIS — R1013 Epigastric pain: Secondary | ICD-10-CM | POA: Insufficient documentation

## 2022-05-19 NOTE — ED Triage Notes (Signed)
Pt reports mid upper sharp back pain (thoracic) and epigastric pain that started 2-3 days ago. Denies injury, nausea or vomiting. No meds for the pain. Standing for long periods of time and eating makes the pain worse.

## 2022-05-20 ENCOUNTER — Emergency Department (HOSPITAL_BASED_OUTPATIENT_CLINIC_OR_DEPARTMENT_OTHER)
Admission: EM | Admit: 2022-05-20 | Discharge: 2022-05-20 | Disposition: A | Discharge status: Home / Self Care | Payer: No Typology Code available for payment source | Attending: Emergency Medicine | Admitting: Emergency Medicine

## 2022-05-20 ENCOUNTER — Emergency Department (HOSPITAL_BASED_OUTPATIENT_CLINIC_OR_DEPARTMENT_OTHER): Payer: No Typology Code available for payment source

## 2022-05-20 DIAGNOSIS — M546 Pain in thoracic spine: Secondary | ICD-10-CM

## 2022-05-20 LAB — CBC
HCT: 47.1 % (ref 39.0–52.0)
Hemoglobin: 15.7 g/dL (ref 13.0–17.0)
MCH: 27.7 pg (ref 26.0–34.0)
MCHC: 33.3 g/dL (ref 30.0–36.0)
MCV: 83.1 fL (ref 80.0–100.0)
Platelets: 237 10*3/uL (ref 150–400)
RBC: 5.67 MIL/uL (ref 4.22–5.81)
RDW: 13.8 % (ref 11.5–15.5)
WBC: 6 10*3/uL (ref 4.0–10.5)
nRBC: 0 % (ref 0.0–0.2)

## 2022-05-20 LAB — COMPREHENSIVE METABOLIC PANEL
ALT: 30 U/L (ref 0–44)
AST: 20 U/L (ref 15–41)
Albumin: 4.6 g/dL (ref 3.5–5.0)
Alkaline Phosphatase: 59 U/L (ref 38–126)
Anion gap: 9 (ref 5–15)
BUN: 11 mg/dL (ref 6–20)
CO2: 27 mmol/L (ref 22–32)
Calcium: 9.3 mg/dL (ref 8.9–10.3)
Chloride: 103 mmol/L (ref 98–111)
Creatinine, Ser: 1.31 mg/dL — ABNORMAL HIGH (ref 0.61–1.24)
GFR, Estimated: >60 mL/min (ref >60)
Glucose, Bld: 84 mg/dL (ref 70–99)
Potassium: 3.4 mmol/L — ABNORMAL LOW (ref 3.5–5.1)
Sodium: 139 mmol/L (ref 135–145)
Total Bilirubin: 2 mg/dL — ABNORMAL HIGH (ref 0.3–1.2)
Total Protein: 8.1 g/dL (ref 6.5–8.1)

## 2022-05-20 LAB — TROPONIN I (HIGH SENSITIVITY)
Troponin I (High Sensitivity): 5 ng/L (ref <18)
Troponin I (High Sensitivity): 7 ng/L (ref <18)

## 2022-05-20 LAB — D-DIMER, QUANTITATIVE: D-Dimer, Quant: 0.32 ug/mL-FEU (ref 0.00–0.50)

## 2022-05-20 LAB — LIPASE, BLOOD: Lipase: 27 U/L (ref 11–51)

## 2022-05-20 MED ORDER — FAMOTIDINE 20 MG PO TABS: 20.0000 mg | ORAL_TABLET | Freq: Every day | ORAL | 0 refills | Status: AC

## 2022-05-20 MED ORDER — ALUM & MAG HYDROXIDE-SIMETH 200-200-20 MG/5ML PO SUSP
30.0000 mL | Freq: Once | ORAL | Status: AC
  Administered 2022-05-20: 30 mL via ORAL
  Filled 2022-05-20: qty 30

## 2022-05-20 MED ORDER — ONDANSETRON 4 MG PO TBDP: 4.0000 mg | ORAL_TABLET | Freq: Three times a day (TID) | ORAL | 0 refills | Status: DC | PRN

## 2022-05-20 MED ORDER — KETOROLAC TROMETHAMINE 15 MG/ML IJ SOLN
15.0000 mg | Freq: Once | INTRAMUSCULAR | Status: AC
  Administered 2022-05-20: 15 mg via INTRAVENOUS
  Filled 2022-05-20: qty 1

## 2022-05-20 NOTE — ED Notes (Signed)
Patient transported to X-ray 

## 2022-05-20 NOTE — ED Provider Notes (Signed)
[place] [facility] [place] Provider Note   CSN: [id number] Arrival date & time: 05/19/22  2341     History  Chief Complaint  Patient presents with   Back Pain    Benjamin James is a 37 y.o. male.  The history is provided by the patient and medical records.  Back Pain Benjamin James is a 37 y.o. male who presents to the Emergency Department complaining of back pain.  He presents to the emergency department for evaluation of 2 to 3 days of midthoracic back pain, worsening today.  He describes it as a squeezing pain that is worse with standing.  It does wax and wane.  No significant change with exertion.  No pain on breathing.  He feels like it is hard to breathe at night sometimes.  On review of systems he mentions 4 months of generalized abdominal discomfort with occasional nausea.  He also reports epigastric discomfort.  No fever, vomiting, cough, leg swelling or pain.  No reported injuries.  No known medical problems.  No IV drug use.  No new tattoos.    Home Medications Prior to Admission medications   Medication Sig Start Date End Date Taking? Authorizing Provider  famotidine (PEPCID) 20 MG tablet Take 1 tablet (20 mg total) by mouth daily. 05/20/22  Yes Quintella Reichert, MD  ondansetron (ZOFRAN-ODT) 4 MG disintegrating tablet Take 1 tablet (4 mg total) by mouth every 8 (eight) hours as needed for nausea or vomiting. 05/20/22  Yes Quintella Reichert, MD  clindamycin (CLEOCIN) 300 MG capsule Take 1 capsule (300 mg total) by mouth 4 (four) times daily. X 7 days 09/24/19   Mesner, Corene Cornea, MD  dicyclomine (BENTYL) 20 MG tablet Take 1 tablet (20 mg total) by mouth 3 (three) times daily as needed for spasms (abdominal cramping). 11/08/20   Long, Wonda Olds, MD  ibuprofen (ADVIL) 800 MG tablet Take 1 tablet (800 mg total) by mouth 3 (three) times daily. 09/24/19   Mesner, Corene Cornea, MD  pantoprazole (PROTONIX) 40 MG tablet Take 1 tablet (40 mg total) by mouth daily.  11/08/20 12/08/20  Long, Wonda Olds, MD      Allergies    Patient has no known allergies.    Review of Systems   Review of Systems  Musculoskeletal:  Positive for back pain.  All other systems reviewed and are negative.   Physical Exam Updated Vital Signs BP (!) 153/92 (BP Location: Right Arm)   Pulse 79   Temp 98.7 F (37.1 C) (Oral)   Resp 18   Ht 5' 9"$  (1.753 m)   Wt 86.2 kg   SpO2 94%   BMI 28.06 kg/m  Physical Exam Vitals and nursing note reviewed.  Constitutional:      Appearance: He is well-developed.  HENT:     Head: Normocephalic and atraumatic.  Cardiovascular:     Rate and Rhythm: Normal rate and regular rhythm.     Heart sounds: No murmur heard. Pulmonary:     Effort: Pulmonary effort is normal. No respiratory distress.     Breath sounds: Normal breath sounds.  Abdominal:     Palpations: Abdomen is soft.     Tenderness: There is no abdominal tenderness. There is no guarding or rebound.  Musculoskeletal:        General: No swelling or tenderness.     Comments: 2+ DP pulses bilaterally  Skin:    General: Skin is warm and dry.  Neurological:     Mental  Status: He is alert and oriented to person, place, and time.     Comments: 5 out of 5 strength in all 4 extremities with sensation to light touch intact in all 4 extremities  Psychiatric:        Behavior: Behavior normal.     ED Results / Procedures / Treatments   Labs (all labs ordered are listed, but only abnormal results are displayed) Labs Reviewed  COMPREHENSIVE METABOLIC PANEL - Abnormal; Notable for the following components:      Result Value   Potassium 3.4 (*)    Creatinine, Ser 1.31 (*)    Total Bilirubin 2.0 (*)    All other components within normal limits  LIPASE, BLOOD  CBC  D-DIMER, QUANTITATIVE  TROPONIN I (HIGH SENSITIVITY)  TROPONIN I (HIGH SENSITIVITY)    EKG EKG Interpretation  Date/Time:  Wednesday May 19 2022 23:50:32 EST Ventricular Rate:  83 PR  Interval:  133 QRS Duration: 89 QT Interval:  353 QTC Calculation: 415 R Axis:   98 Text Interpretation: Sinus rhythm Consider left ventricular hypertrophy ST elevationin II, III, avF, V2-6 c/w early repol vs pericarditis. No previous ECGs available Confirmed by Quintella Reichert [id number]) on 05/20/2022 12:46:28 AM  Radiology DG Chest 2 View  Result Date: 05/20/2022 CLINICAL DATA:  Chest pain. EXAM: CHEST - 2 VIEW COMPARISON:  January 17, 2009 FINDINGS: The heart size and mediastinal contours are within normal limits. Both lungs are clear. The visualized skeletal structures are unremarkable. IMPRESSION: No active cardiopulmonary disease. Electronically Signed   By: Virgina Norfolk M.D.   On: 05/20/2022 00:32    Procedures Procedures    Medications Ordered in ED Medications  ketorolac (TORADOL) 15 MG/ML injection 15 mg (has no administration in time range)  alum & mag hydroxide-simeth (MAALOX/MYLANTA) 200-200-20 MG/5ML suspension 30 mL (30 mLs Oral Given 05/20/22 0019)    ED Course/ Medical Decision Making/ A&P                             Medical Decision Making Amount and/or Complexity of Data Reviewed Labs: ordered. Radiology: ordered.  Risk OTC drugs. Prescription drug management.   Patient here for evaluation of mid to lower thoracic back pain that is atraumatic.  He is neurologically intact on evaluation and well-perfused.  EKG does show some borderline ST elevation that is consistent with early repolarization.  Troponins are negative x 2.  Current clinical picture is not consistent with ACS, dissection.  Doubt PE, low risk by Wells criteria and negative D-dimer.  Chest x-ray is without acute abnormality, no evidence of pneumonia-images personally reviewed and interpreted, agree with radiologist interpretation.  Current clinical picture is not consistent with epidural abscess or acute cord compression.  Patient also reports abdominal discomfort on his review of systems and has  been present for 4 months.  After treat with Maalox his abdominal discomfort resolved.  In terms of his back pain, suspect this is musculoskeletal in nature, recommend NSAIDs over-the-counter with PCP follow-up.  In terms of his abdominal discomfort-suspect this is related to reflux/indigestion.  Recommend Pepcid.  Also recommend outpatient follow-up.        Final Clinical Impression(s) / ED Diagnoses Final diagnoses:  Acute midline thoracic back pain    Rx / DC Orders ED Discharge Orders          Ordered    ondansetron (ZOFRAN-ODT) 4 MG disintegrating tablet  Every 8 hours PRN  05/20/22 0315    famotidine (PEPCID) 20 MG tablet  Daily        05/20/22 0315              Quintella Reichert, MD 05/20/22 [phone]

## 2022-05-20 NOTE — ED Notes (Signed)
ED Provider at bedside. 

## 2023-08-11 ENCOUNTER — Encounter (HOSPITAL_BASED_OUTPATIENT_CLINIC_OR_DEPARTMENT_OTHER): Payer: Self-pay

## 2023-08-11 ENCOUNTER — Other Ambulatory Visit: Payer: Self-pay

## 2023-08-11 DIAGNOSIS — R1084 Generalized abdominal pain: Secondary | ICD-10-CM | POA: Insufficient documentation

## 2023-08-11 DIAGNOSIS — R112 Nausea with vomiting, unspecified: Secondary | ICD-10-CM | POA: Insufficient documentation

## 2023-08-11 DIAGNOSIS — R079 Chest pain, unspecified: Secondary | ICD-10-CM | POA: Insufficient documentation

## 2023-08-11 DIAGNOSIS — R197 Diarrhea, unspecified: Secondary | ICD-10-CM | POA: Insufficient documentation

## 2023-08-11 LAB — COMPREHENSIVE METABOLIC PANEL WITH GFR
ALT: 43 U/L (ref 0–44)
AST: 27 U/L (ref 15–41)
Albumin: 4.9 g/dL (ref 3.5–5.0)
Alkaline Phosphatase: 76 U/L (ref 38–126)
Anion gap: 14 (ref 5–15)
BUN: 15 mg/dL (ref 6–20)
CO2: 26 mmol/L (ref 22–32)
Calcium: 10.1 mg/dL (ref 8.9–10.3)
Chloride: 100 mmol/L (ref 98–111)
Creatinine, Ser: 1.4 mg/dL — ABNORMAL HIGH (ref 0.61–1.24)
GFR, Estimated: >60 mL/min (ref >60)
Glucose, Bld: 99 mg/dL (ref 70–99)
Potassium: 3.9 mmol/L (ref 3.5–5.1)
Sodium: 139 mmol/L (ref 135–145)
Total Bilirubin: 0.9 mg/dL (ref 0.0–1.2)
Total Protein: 8 g/dL (ref 6.5–8.1)

## 2023-08-11 LAB — CBC
HCT: 50.6 % (ref 39.0–52.0)
Hemoglobin: 17 g/dL (ref 13.0–17.0)
MCH: 27.6 pg (ref 26.0–34.0)
MCHC: 33.6 g/dL (ref 30.0–36.0)
MCV: 82 fL (ref 80.0–100.0)
Platelets: 255 10*3/uL (ref 150–400)
RBC: 6.17 MIL/uL — ABNORMAL HIGH (ref 4.22–5.81)
RDW: 13.2 % (ref 11.5–15.5)
WBC: 6.6 10*3/uL (ref 4.0–10.5)
nRBC: 0 % (ref 0.0–0.2)

## 2023-08-11 LAB — LIPASE, BLOOD: Lipase: 26 U/L (ref 11–51)

## 2023-08-11 MED ORDER — ONDANSETRON 4 MG PO TBDP
4.0000 mg | ORAL_TABLET | Freq: Once | ORAL | Status: AC | PRN
  Administered 2023-08-11: 4 mg via ORAL

## 2023-08-11 MED ORDER — ONDANSETRON 4 MG PO TBDP
ORAL_TABLET | ORAL | Status: AC
  Filled 2023-08-11: qty 1

## 2023-08-11 NOTE — ED Triage Notes (Signed)
 Pt. Reports feeling bad last couple days with vomiting and fever of 99 per pt. Also diarrhea for 2 days and bodyaches. No fever at time of triage

## 2023-08-12 ENCOUNTER — Other Ambulatory Visit: Payer: Self-pay

## 2023-08-12 ENCOUNTER — Emergency Department (HOSPITAL_BASED_OUTPATIENT_CLINIC_OR_DEPARTMENT_OTHER): Payer: Self-pay

## 2023-08-12 ENCOUNTER — Emergency Department (HOSPITAL_BASED_OUTPATIENT_CLINIC_OR_DEPARTMENT_OTHER)
Admission: EM | Admit: 2023-08-12 | Discharge: 2023-08-12 | Disposition: A | Discharge status: Home / Self Care | Payer: Self-pay | Attending: Emergency Medicine | Admitting: Emergency Medicine

## 2023-08-12 DIAGNOSIS — R112 Nausea with vomiting, unspecified: Secondary | ICD-10-CM

## 2023-08-12 LAB — URINALYSIS, ROUTINE W REFLEX MICROSCOPIC
Bilirubin Urine: NEGATIVE
Glucose, UA: NEGATIVE mg/dL
Hgb urine dipstick: NEGATIVE
Ketones, ur: NEGATIVE mg/dL
Leukocytes,Ua: NEGATIVE
Nitrite: NEGATIVE
Protein, ur: 30 mg/dL — AB
Specific Gravity, Urine: 1.025 (ref 1.005–1.030)
pH: 7 (ref 5.0–8.0)

## 2023-08-12 LAB — URINE DRUG SCREEN
Amphetamines: NOT DETECTED
Barbiturates: NOT DETECTED
Benzodiazepines: NOT DETECTED
Cocaine: NOT DETECTED
Fentanyl: NOT DETECTED
Methadone Scn, Ur: NOT DETECTED
Opiates: NOT DETECTED
Tetrahydrocannabinol: DETECTED — AB

## 2023-08-12 LAB — URINALYSIS, MICROSCOPIC (REFLEX)

## 2023-08-12 LAB — TROPONIN T, HIGH SENSITIVITY
Troponin T High Sensitivity: <15 ng/L (ref <19)
Troponin T High Sensitivity: <15 ng/L (ref <19)

## 2023-08-12 MED ORDER — FENTANYL CITRATE PF 50 MCG/ML IJ SOSY
50.0000 ug | PREFILLED_SYRINGE | Freq: Once | INTRAMUSCULAR | Status: AC
  Administered 2023-08-12: 50 ug via INTRAVENOUS
  Filled 2023-08-12: qty 1

## 2023-08-12 MED ORDER — IOHEXOL 300 MG/ML  SOLN
100.0000 mL | Freq: Once | INTRAMUSCULAR | Status: AC | PRN
  Administered 2023-08-12: 100 mL via INTRAVENOUS

## 2023-08-12 MED ORDER — ONDANSETRON HCL 4 MG/2ML IJ SOLN
4.0000 mg | Freq: Once | INTRAMUSCULAR | Status: AC
  Administered 2023-08-12: 4 mg via INTRAVENOUS
  Filled 2023-08-12: qty 2

## 2023-08-12 MED ORDER — SODIUM CHLORIDE 0.9 % IV BOLUS
1000.0000 mL | Freq: Once | INTRAVENOUS | Status: AC
  Administered 2023-08-12: 1000 mL via INTRAVENOUS

## 2023-08-12 MED ORDER — ONDANSETRON 4 MG PO TBDP: 4.0000 mg | ORAL_TABLET | Freq: Three times a day (TID) | ORAL | 0 refills | Status: AC | PRN

## 2023-08-12 NOTE — ED Notes (Signed)
 Patient transported to CT

## 2023-08-12 NOTE — ED Notes (Signed)
 Patient transported to X-ray

## 2023-08-12 NOTE — ED Provider Notes (Signed)
 [place] [facility] Provider Note   CSN: [id number] Arrival date & time: 08/11/23  2253     History  Chief Complaint  Patient presents with   Emesis    Benjamin James is a 38 y.o. male.  Patient denies medical history.  He believes he has been exposed to mold in his bathroom.  He is here with abdominal pain, nausea, vomiting and diarrhea body aches and chills.  Denies fever but has felt hot but not checked his temperature.  Has diffuse crampy abdominal pain associate with multiple zones of vomiting and diarrhea over the past several days.  Believes he vomited about 5 times today that have been nonbloody and nonbilious.  Had 3-4 episodes of diarrhea as well.  Chills but no fever.  No pain with urination or blood in the urine.  Some chest pain after vomiting.  Still has appendix and gallbladder.  Denies any chronic abdominal pain history. Denies fever but has had chills.  No travel or sick contacts.  No difficulty breathing.  Does use tobacco and marijuana.  Drinks alcohol occasionally.  No black or bloody stools.  The history is provided by the patient.  Emesis Associated symptoms: abdominal pain and diarrhea   Associated symptoms: no arthralgias, no fever, no headaches and no myalgias        Home Medications Prior to Admission medications   Medication Sig Start Date End Date Taking? Authorizing Provider  clindamycin  (CLEOCIN ) 300 MG capsule Take 1 capsule (300 mg total) by mouth 4 (four) times daily. X 7 days 09/24/19   Mesner, Reymundo Caulk, MD  dicyclomine  (BENTYL ) 20 MG tablet Take 1 tablet (20 mg total) by mouth 3 (three) times daily as needed for spasms (abdominal cramping). 11/08/20   Long, Shereen Dike, MD  famotidine  (PEPCID ) 20 MG tablet Take 1 tablet (20 mg total) by mouth daily. 05/20/22   Kelsey Patricia, MD  famotidine  (PEPCID ) 20 MG tablet Take 1 tablet (20 mg total) by mouth daily. 05/20/22   Kelsey Patricia, MD  ibuprofen  (ADVIL ) 800 MG tablet  Take 1 tablet (800 mg total) by mouth 3 (three) times daily. 09/24/19   Mesner, Reymundo Caulk, MD  ondansetron  (ZOFRAN -ODT) 4 MG disintegrating tablet Take 1 tablet (4 mg total) by mouth every 8 (eight) hours as needed for nausea or vomiting. 05/20/22   Kelsey Patricia, MD  pantoprazole  (PROTONIX ) 40 MG tablet Take 1 tablet (40 mg total) by mouth daily. 11/08/20 12/08/20  Long, Joshua G, MD      Allergies    Patient has no known allergies.    Review of Systems   Review of Systems  Constitutional:  Positive for activity change and appetite change. Negative for fever.  HENT:  Negative for congestion and rhinorrhea.   Respiratory:  Negative for chest tightness and shortness of breath.   Cardiovascular:  Negative for chest pain.  Gastrointestinal:  Positive for abdominal pain, diarrhea, nausea and vomiting.  Genitourinary:  Negative for dysuria and hematuria.  Musculoskeletal:  Negative for arthralgias and myalgias.  Neurological:  Negative for dizziness, weakness and headaches.   all other systems are negative except as noted in the HPI and PMH.    Physical Exam Updated Vital Signs BP (!) 160/98 (BP Location: Right Arm)   Pulse 69   Temp 98.9 F (37.2 C) (Oral)   Resp 18   Ht 5\' 9"  (1.753 m)   Wt 93 kg   SpO2 98%   BMI 30.27 kg/m  Physical  Exam Vitals and nursing note reviewed.  Constitutional:      General: He is not in acute distress.    Appearance: He is well-developed. He is not ill-appearing.  HENT:     Head: Normocephalic and atraumatic.     Mouth/Throat:     Pharynx: No oropharyngeal exudate.  Eyes:     Conjunctiva/sclera: Conjunctivae normal.     Pupils: Pupils are equal, round, and reactive to light.  Neck:     Comments: No meningismus. Cardiovascular:     Rate and Rhythm: Normal rate and regular rhythm.     Heart sounds: Normal heart sounds. No murmur heard. Pulmonary:     Effort: Pulmonary effort is normal. No respiratory distress.     Breath sounds: Normal breath  sounds.  Abdominal:     Palpations: Abdomen is soft.     Tenderness: There is abdominal tenderness. There is no guarding or rebound.     Comments: Diffuse crampy abdominal tenderness, no guarding or rebound  Musculoskeletal:        General: No tenderness. Normal range of motion.     Cervical back: Normal range of motion and neck supple.  Skin:    General: Skin is warm.  Neurological:     Mental Status: He is alert and oriented to person, place, and time.     Cranial Nerves: No cranial nerve deficit.     Motor: No abnormal muscle tone.     Coordination: Coordination normal.     Comments:  5/5 strength throughout. CN 2-12 intact.Equal grip strength.   Psychiatric:        Behavior: Behavior normal.     ED Results / Procedures / Treatments   Labs (all labs ordered are listed, but only abnormal results are displayed) Labs Reviewed  COMPREHENSIVE METABOLIC PANEL WITH GFR - Abnormal; Notable for the following components:      Result Value   Creatinine, Ser 1.40 (*)    All other components within normal limits  CBC - Abnormal; Notable for the following components:   RBC 6.17 (*)    All other components within normal limits  URINALYSIS, ROUTINE W REFLEX MICROSCOPIC - Abnormal; Notable for the following components:   Protein, ur 30 (*)    All other components within normal limits  URINALYSIS, MICROSCOPIC (REFLEX) - Abnormal; Notable for the following components:   Bacteria, UA RARE (*)    All other components within normal limits  LIPASE, BLOOD  URINE DRUG SCREEN  TROPONIN T, HIGH SENSITIVITY  TROPONIN T, HIGH SENSITIVITY    EKG None  Radiology CT ABDOMEN PELVIS W CONTRAST Result Date: 08/12/2023 CLINICAL DATA:  Abdominal pain with vomiting and diarrhea. EXAM: CT ABDOMEN AND PELVIS WITH CONTRAST TECHNIQUE: Multidetector CT imaging of the abdomen and pelvis was performed using the standard protocol following bolus administration of intravenous contrast. RADIATION DOSE REDUCTION:  This exam was performed according to the departmental dose-optimization program which includes automated exposure control, adjustment of the mA and/or kV according to patient size and/or use of iterative reconstruction technique. CONTRAST:  OMNIPAQUE  IOHEXOL  300 MG/ML  SOLN COMPARISON:  11/08/2020 FINDINGS: Lower chest: No acute findings. Hepatobiliary: No suspicious focal abnormality within the liver parenchyma. There is no evidence for gallstones, gallbladder wall thickening, or pericholecystic fluid. No intrahepatic or extrahepatic biliary dilation. Pancreas: No focal mass lesion. No dilatation of the main duct. No intraparenchymal cyst. No peripancreatic edema. Spleen: No splenomegaly. No suspicious focal mass lesion. Adrenals/Urinary Tract: No adrenal nodule or mass. Kidneys  unremarkable. No evidence for hydroureter. The urinary bladder appears normal for the degree of distention. Stomach/Bowel: Stomach is unremarkable. No gastric wall thickening. No evidence of outlet obstruction. Duodenum is normally positioned as is the ligament of Treitz. No small bowel wall thickening. No small bowel dilatation. The terminal ileum is normal. The appendix is normal. No gross colonic mass. No colonic wall thickening. Vascular/Lymphatic: No abdominal aortic aneurysm. No abdominal aortic atherosclerotic calcification. There is no gastrohepatic or hepatoduodenal ligament lymphadenopathy. No retroperitoneal or mesenteric lymphadenopathy. No pelvic sidewall lymphadenopathy. Reproductive: The prostate gland and seminal vesicles are unremarkable. Other: No intraperitoneal free fluid. Musculoskeletal: No worrisome lytic or sclerotic osseous abnormality. IMPRESSION: No acute findings in the abdomen or pelvis. Specifically, no findings to explain the patient's history of abdominal pain with vomiting and diarrhea. Electronically Signed   By: Donnal Fusi M.D.   On: 08/12/2023 05:26   DG Chest 2 View Result Date:  08/12/2023 CLINICAL DATA:  Dyspnea EXAM: CHEST - 2 VIEW COMPARISON:  None Available. FINDINGS: The heart size and mediastinal contours are within normal limits. Both lungs are clear. The visualized skeletal structures are unremarkable. IMPRESSION: No active cardiopulmonary disease. Electronically Signed   By: Worthy Heads M.D.   On: 08/12/2023 02:29    Procedures Procedures    Medications Ordered in ED Medications  sodium chloride 0.9 % bolus 1,000 mL (has no administration in time range)  ondansetron  (ZOFRAN ) injection 4 mg (has no administration in time range)  fentaNYL (SUBLIMAZE) injection 50 mcg (has no administration in time range)  ondansetron  (ZOFRAN -ODT) disintegrating tablet 4 mg (4 mg Oral Given 08/11/23 2310)    ED Course/ Medical Decision Making/ A&P                                 Medical Decision Making Amount and/or Complexity of Data Reviewed Labs: ordered. Decision-making details documented in ED Course. Radiology: ordered and independent interpretation performed. Decision-making details documented in ED Course. ECG/medicine tests: ordered and independent interpretation performed. Decision-making details documented in ED Course.  Risk Prescription drug management.   2 days of nausea, vomiting, diarrhea and abdominal pain.  Stable vitals on arrival.  No distress.  Abdomen soft without peritoneal signs.  Will hydrate.  Treat symptoms.  Labs are reassuring and*stable creatinine.  LFTs and lipase are normal.  Abdomen soft without peritoneal signs.  No vomiting in the ED.  Workup reassuring with stable LFTs, lipase and creatinine.  Urinalysis negative for infection.  CT scan with acute findings to explain his vomiting and diarrhea. EKG shows diffuse ST elevations similar to previous, consistent with early repolarization.  Troponin negative x 2 with low suspicion for ACS.  He is concerned this could be due to black mold.  Discussed with him we cannot confirm or  exclude this. No respiratory symptoms currently.  Troponin negative x 2 with low suspicion for ACS.  Started clear liquid diet, advance slowly as tolerated, follow-up with PCP.  Return to the ED with new or worsening symptoms.        Final Clinical Impression(s) / ED Diagnoses Final diagnoses:  Nausea vomiting and diarrhea    Rx / DC Orders ED Discharge Orders     None         Sua Spadafora, Mara [place], MD 08/12/23 [phone]

## 2023-08-12 NOTE — ED Notes (Signed)
Apple juice given to pt  

## 2023-08-12 NOTE — Discharge Instructions (Signed)
 Start with a clear liquid diet and advance slowly as tolerated.  Take the nausea medication as prescribed.  Follow-up with your doctor.  Return to the ED with worsening abdominal pain, fever, vomiting, not able to eat or drink or other concerns
